# Patient Record
Sex: Male | Born: 2000 | Hispanic: Yes | Marital: Single | Smoking: Current some day smoker
Health system: Southern US, Community
[De-identification: ages and names within clinical notes are randomized; demographics above are authoritative.]

---

## 2020-07-30 ENCOUNTER — Ambulatory Visit (INDEPENDENT_AMBULATORY_CARE_PROVIDER_SITE_OTHER): Payer: Self-pay

## 2020-07-30 VITALS — Wt 140.0 lb

## 2020-07-30 DIAGNOSIS — Z23 Encounter for immunization: Secondary | ICD-10-CM

## 2020-07-30 NOTE — Progress Notes (Signed)
   Covid-19 Vaccination Clinic  Name:  Isaiah Bates    MRN: [id number] DOB: 11/06/2001  07/30/2020  Mr. Isaiah Bates was observed post Covid-19 immunization for 15 minutes without incident. He was provided with Vaccine Information Sheet and instruction to access the V-Safe system.   Mr. Isaiah Bates was instructed to call 911 with any severe reactions post vaccine: Marland Kitchen Difficulty breathing  . Swelling of face and throat  . A fast heartbeat  . A bad rash all over body  . Dizziness and weakness   Immunizations Administered    Name Date Dose VIS Date Route   Pfizer COVID-19 Vaccine 07/30/2020 11:27 AM 0.3 mL 01/27/2019 Intramuscular   Manufacturer: [employer], [employer]   Lot: [id number]   NDC: [address]-8

## 2020-08-20 ENCOUNTER — Ambulatory Visit: Payer: Self-pay

## 2020-08-27 ENCOUNTER — Other Ambulatory Visit: Payer: Self-pay

## 2020-08-27 ENCOUNTER — Ambulatory Visit (INDEPENDENT_AMBULATORY_CARE_PROVIDER_SITE_OTHER): Payer: HRSA Program

## 2020-08-27 DIAGNOSIS — Z23 Encounter for immunization: Secondary | ICD-10-CM | POA: Diagnosis not present

## 2020-08-27 NOTE — Progress Notes (Signed)
° °  Covid-19 Vaccination Clinic  Name:  Isaiah Bates    MRN: [id number] DOB: 2001-09-15  08/27/2020  Mr. Isaiah Bates was observed post Covid-19 immunization for 15 minutes without incident. He was provided with Vaccine Information Sheet and instruction to access the V-Safe system.   Mr. Isaiah Bates was instructed to call 911 with any severe reactions post vaccine:  Difficulty breathing   Swelling of face and throat   A fast heartbeat   A bad rash all over body   Dizziness and weakness   Immunizations Administered    Name Date Dose VIS Date Route   Pfizer COVID-19 Vaccine 08/27/2020 11:35 AM 0.3 mL 01/27/2019 Intramuscular   Manufacturer: [employer], [employer]   Lot: [id number]   NDC: [id number]

## 2021-01-24 ENCOUNTER — Other Ambulatory Visit: Payer: Self-pay

## 2021-01-24 ENCOUNTER — Ambulatory Visit
Admission: RE | Admit: 2021-01-24 | Discharge: 2021-01-24 | Disposition: A | Discharge status: Home / Self Care | Payer: No Typology Code available for payment source | Source: Ambulatory Visit | Attending: Nurse Practitioner | Admitting: Nurse Practitioner

## 2021-01-24 ENCOUNTER — Other Ambulatory Visit: Payer: Self-pay | Admitting: Nurse Practitioner

## 2021-01-24 DIAGNOSIS — M25511 Pain in right shoulder: Secondary | ICD-10-CM

## 2021-07-10 ENCOUNTER — Emergency Department (HOSPITAL_COMMUNITY): Payer: Self-pay

## 2021-07-10 ENCOUNTER — Emergency Department (HOSPITAL_COMMUNITY)
Admission: EM | Admit: 2021-07-10 | Discharge: 2021-07-10 | Disposition: A | Discharge status: Home / Self Care | Payer: Self-pay | Attending: Student | Admitting: Student

## 2021-07-10 DIAGNOSIS — W01198A Fall on same level from slipping, tripping and stumbling with subsequent striking against other object, initial encounter: Secondary | ICD-10-CM | POA: Insufficient documentation

## 2021-07-10 DIAGNOSIS — S02119A Unspecified fracture of occiput, initial encounter for closed fracture: Secondary | ICD-10-CM

## 2021-07-10 DIAGNOSIS — S0211GA Other fracture of occiput, right side, initial encounter for closed fracture: Secondary | ICD-10-CM | POA: Insufficient documentation

## 2021-07-10 DIAGNOSIS — U071 COVID-19: Secondary | ICD-10-CM | POA: Insufficient documentation

## 2021-07-10 LAB — CBC WITH DIFFERENTIAL/PLATELET
Abs Immature Granulocytes: 0.02 10*3/uL (ref 0.00–0.07)
Basophils Absolute: 0 10*3/uL (ref 0.0–0.1)
Basophils Relative: 1 %
Eosinophils Absolute: 0.1 10*3/uL (ref 0.0–0.5)
Eosinophils Relative: 1 %
HCT: 47 % (ref 39.0–52.0)
Hemoglobin: 15.2 g/dL (ref 13.0–17.0)
Immature Granulocytes: 0 %
Lymphocytes Relative: 13 %
Lymphs Abs: 0.8 10*3/uL (ref 0.7–4.0)
MCH: 28.6 pg (ref 26.0–34.0)
MCHC: 32.3 g/dL (ref 30.0–36.0)
MCV: 88.5 fL (ref 80.0–100.0)
Monocytes Absolute: 0.7 10*3/uL (ref 0.1–1.0)
Monocytes Relative: 12 %
Neutro Abs: 4.2 10*3/uL (ref 1.7–7.7)
Neutrophils Relative %: 73 %
Platelets: 314 10*3/uL (ref 150–400)
RBC: 5.31 MIL/uL (ref 4.22–5.81)
RDW: 14.7 % (ref 11.5–15.5)
WBC: 5.8 10*3/uL (ref 4.0–10.5)
nRBC: 0 % (ref 0.0–0.2)

## 2021-07-10 LAB — BASIC METABOLIC PANEL
Anion gap: 10 (ref 5–15)
BUN: 11 mg/dL (ref 6–20)
CO2: 27 mmol/L (ref 22–32)
Calcium: 9.7 mg/dL (ref 8.9–10.3)
Chloride: 102 mmol/L (ref 98–111)
Creatinine, Ser: 0.8 mg/dL (ref 0.61–1.24)
GFR, Estimated: >60 mL/min (ref >60)
Glucose, Bld: 121 mg/dL — ABNORMAL HIGH (ref 70–99)
Potassium: 3.7 mmol/L (ref 3.5–5.1)
Sodium: 139 mmol/L (ref 135–145)

## 2021-07-10 LAB — MAGNESIUM: Magnesium: 2.2 mg/dL (ref 1.7–2.4)

## 2021-07-10 MED ORDER — OXYCODONE-ACETAMINOPHEN 5-325 MG PO TABS: 1.0000 | ORAL_TABLET | Freq: Four times a day (QID) | ORAL | 0 refills | Status: AC | PRN

## 2021-07-10 MED ORDER — IBUPROFEN 600 MG PO TABS: 600.0000 mg | ORAL_TABLET | Freq: Three times a day (TID) | ORAL | 0 refills | Status: AC

## 2021-07-10 MED ORDER — OXYCODONE-ACETAMINOPHEN 5-325 MG PO TABS
1.0000 | ORAL_TABLET | Freq: Once | ORAL | Status: AC
  Administered 2021-07-10: 1 via ORAL
  Filled 2021-07-10: qty 1

## 2021-07-10 MED ORDER — IBUPROFEN 400 MG PO TABS
600.0000 mg | ORAL_TABLET | Freq: Once | ORAL | Status: AC
  Administered 2021-07-10: 600 mg via ORAL
  Filled 2021-07-10: qty 1

## 2021-07-10 NOTE — Discharge Instructions (Addendum)
You are seen in the emergency department for evaluation of headache after a fall.  Your CAT scans show a small skull fracture on the back of the right side of the head but no broken bones in the nose.  You are safe for discharge at this time and this skull fracture will heal on its own.  I have provided the number for a neurosurgeon to follow-up if your head pain is continuing.  I will provide a short course of opioid pain medication that is only to be used for breakthrough pain only.  Please use ibuprofen for pain control 3 times daily 600 mg.  Turn the emergency department if he have new or worsening nausea, vomiting, difficulty walking, numbness, tingling or any other concerning symptoms.

## 2021-07-10 NOTE — ED Triage Notes (Signed)
Pt here d/t falling and hitting head Saturday. Pt endorses hitting head on grill and LOC per family. Pt alert and oriented X4.

## 2021-07-10 NOTE — ED Provider Notes (Signed)
[facility] EMERGENCY DEPARTMENT Provider Note   CSN: [id number] Arrival date & time: 07/10/21  1130     History Chief Complaint  Patient presents with   Head Injury    Isaiah Bates is a 20 y.o. male with no significant past medical history who presents to the emergency department for evaluation of a head injury.  Patient states that approximately 3 AM on 07/08/2021 the patient tripped and struck the back of his head against a grill and fell forward onto his face.  He states that he had loss of consciousness of unknown duration.  He states over the last 24 hours he has had persistent lightheadedness and dizziness but no nausea, vomiting, diarrhea, numbness, tingling, weakness or other neurologic or systemic complaints.  He states he has had decreased appetite and has not had any p.o. over the last 2 days.  Patient endorses posterior headache, pain over the bridge of the nose and is requesting a COVID test today.   Head Injury Associated symptoms: headache and nausea   Associated symptoms: no seizures and no vomiting       No past medical history on file.  There are no problems to display for this patient.     No family history on file.     Home Medications Prior to Admission medications   Medication Sig Start Date End Date Taking? Authorizing Provider  ibuprofen (ADVIL) 600 MG tablet Take 1 tablet (600 mg total) by mouth 3 (three) times daily. 07/10/21  Yes Cortlandt Capuano, MD  oxyCODONE-acetaminophen (PERCOCET/ROXICET) 5-325 MG tablet Take 1 tablet by mouth every 6 (six) hours as needed for severe pain. 07/10/21  Yes Yale Golla, MD    Allergies    Patient has no allergy information on record.  Review of Systems   Review of Systems  Constitutional:  Negative for chills and fever.  HENT:  Negative for ear pain and sore throat.   Eyes:  Negative for pain and visual disturbance.  Respiratory:  Negative for cough and shortness of breath.    Cardiovascular:  Negative for chest pain and palpitations.  Gastrointestinal:  Positive for nausea. Negative for abdominal pain and vomiting.  Genitourinary:  Negative for dysuria and hematuria.  Musculoskeletal:  Negative for arthralgias and back pain.  Skin:  Negative for color change and rash.  Neurological:  Positive for light-headedness and headaches. Negative for seizures and syncope.  All other systems reviewed and are negative.  Physical Exam Updated Vital Signs BP 123/73   Pulse 78   Temp 98.8 F (37.1 C) (Oral)   Resp 17   SpO2 100%   Physical Exam Vitals and nursing note reviewed.  Constitutional:      Appearance: He is well-developed.  HENT:     Head: Normocephalic.  Eyes:     Conjunctiva/sclera: Conjunctivae normal.  Cardiovascular:     Rate and Rhythm: Normal rate and regular rhythm.     Heart sounds: No murmur heard. Pulmonary:     Effort: Pulmonary effort is normal. No respiratory distress.     Breath sounds: Normal breath sounds.  Abdominal:     Palpations: Abdomen is soft.     Tenderness: There is no abdominal tenderness.  Musculoskeletal:        General: Tenderness (Right posterior occiput, on nasal bridge) present.     Cervical back: Neck supple.  Skin:    General: Skin is warm and dry.  Neurological:     General: No focal deficit present.  Mental Status: He is alert and oriented to person, place, and time.     Cranial Nerves: No cranial nerve deficit.     Sensory: No sensory deficit.     Motor: No weakness.    ED Results / Procedures / Treatments   Labs (all labs ordered are listed, but only abnormal results are displayed) Labs Reviewed  BASIC METABOLIC PANEL - Abnormal; Notable for the following components:      Result Value   Glucose, Bld 121 (*)    All other components within normal limits  SARS CORONAVIRUS 2 (TAT 6-24 HRS)  CBC WITH DIFFERENTIAL/PLATELET  MAGNESIUM    EKG None  Radiology CT Head Wo Contrast  Result Date:  07/10/2021 CLINICAL DATA:  Recent fall, head and neck injury, persistent pain EXAM: CT HEAD WITHOUT CONTRAST CT CERVICAL SPINE WITHOUT CONTRAST TECHNIQUE: Multidetector CT imaging of the head and cervical spine was performed following the standard protocol without intravenous contrast. Multiplanar CT image reconstructions of the cervical spine were also generated. COMPARISON:  None. FINDINGS: CT HEAD FINDINGS Brain: No acute intracranial hemorrhage mass lesion, midline shift, herniation or hydrocephalus. Gray-white matter differentiation maintained. No focal mass effect or edema. Cisterns are patent. 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 Vascular: No hyperdense vessel or unexpected calcification. Skull: Acute nondisplaced right lateral occipital skull fracture noted beginning medial to the suture line but does extend inferiorly to the skull base along the jugular foramen and medial to the mastoids. Sinuses/Orbits: No acute finding. Other: None. CT CERVICAL SPINE FINDINGS Alignment: Normal. Skull base and vertebrae: No acute fracture. No primary bone lesion or focal pathologic process. Soft tissues and spinal canal: No prevertebral fluid or swelling. No visible canal hematoma. Disc levels:  Preserved vertebral body heights and disc spaces. Upper chest: Negative. Other: None. IMPRESSION: Acute nondisplaced right posterior occipital skull fracture, medial to the suture line but extends inferiorly to the right skull base along the jugular foramen. No acute intracranial abnormality. Mega cisterna magna noted. No acute cervical spine fracture, malalignment by CT. Electronically Signed   By: Judie Petit.  Shick M.D.   On: 07/10/2021 14:37   CT Cervical Spine Wo Contrast  Result Date: 07/10/2021 CLINICAL DATA:  Recent fall, head and neck injury, persistent pain EXAM: CT HEAD WITHOUT CONTRAST CT CERVICAL SPINE WITHOUT CONTRAST TECHNIQUE: Multidetector CT imaging of the head and cervical spine was performed following  the standard protocol without intravenous contrast. Multiplanar CT image reconstructions of the cervical spine were also generated. COMPARISON:  None. FINDINGS: CT HEAD FINDINGS Brain: No acute intracranial hemorrhage mass lesion, midline shift, herniation or hydrocephalus. Gray-white matter differentiation maintained. No focal mass effect or edema. Cisterns are patent. 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 Vascular: No hyperdense vessel or unexpected calcification. Skull: Acute nondisplaced right lateral occipital skull fracture noted beginning medial to the suture line but does extend inferiorly to the skull base along the jugular foramen and medial to the mastoids. Sinuses/Orbits: No acute finding. Other: None. CT CERVICAL SPINE FINDINGS Alignment: Normal. Skull base and vertebrae: No acute fracture. No primary bone lesion or focal pathologic process. Soft tissues and spinal canal: No prevertebral fluid or swelling. No visible canal hematoma. Disc levels:  Preserved vertebral body heights and disc spaces. Upper chest: Negative. Other: None. IMPRESSION: Acute nondisplaced right posterior occipital skull fracture, medial to the suture line but extends inferiorly to the right skull base along the jugular foramen. No acute intracranial abnormality. Mega cisterna magna noted. No acute cervical spine fracture, malalignment  by CT. Electronically Signed   By: Judie Petit.  Shick M.D.   On: 07/10/2021 14:37   CT Maxillofacial Wo Contrast  Result Date: 07/10/2021 CLINICAL DATA:  Fall face forward 2 days ago. Facial pain. Question nasal fracture. EXAM: CT MAXILLOFACIAL WITHOUT CONTRAST TECHNIQUE: Multidetector CT imaging of the maxillofacial structures was performed. Multiplanar CT image reconstructions were also generated. COMPARISON:  None. FINDINGS: Osseous: Nasal bones are intact. Chronic leftward nasal septal deviation noted without obstruction. Mandible is intact. No acute facial fracture is present. Orbits:  The globes and orbits are within normal limits. Sinuses: The paranasal sinuses and mastoid air cells are clear. Soft tissues: Soft tissue swelling is present over the left side of the face without underlying fracture. Limited intracranial: No significant or unexpected finding. IMPRESSION: 1. Soft tissue swelling over the left side of the face without underlying fracture. 2. No acute facial fracture. 3. 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 Electronically Signed   By: Marin Roberts M.D.   On: 07/10/2021 19:24    Procedures Procedures   Medications Ordered in ED Medications  oxyCODONE-acetaminophen (PERCOCET/ROXICET) 5-325 MG per tablet 1 tablet (1 tablet Oral Given 07/10/21 1756)  ibuprofen (ADVIL) tablet 600 mg (600 mg Oral Given 07/10/21 1757)    ED Course  I have reviewed the triage vital signs and the nursing notes.  Pertinent labs & imaging results that were available during my care of the patient were reviewed by me and considered in my medical decision making (see chart for details).    MDM Rules/Calculators/A&P                           Patient seen in the emergency department for evaluation of headache after a fall.  Physical exam reveals tenderness and bogginess to the right posterior occiput as well as tenderness and abrasion over the nasal bridge.  Neurologic exam unremarkable with no focal motor or sensory deficits.  CT head with Acute nondisplaced right posterior occipital skull fracture, no evidence of intracranial hemorrhage or significant intracranial pathology.  CT C-spine unremarkable and CT face with no nasal bone fracture.  I spoke with neurosurgery briefly who agreed with no operative intervention for the patient's skull fracture and states that he can follow-up in the outpatient setting.  Patient was provided with pain control and a COVID test was obtained per patient request.  I will call the patient with results if his COVID test is positive.   Patient able to eat and ambulate in the emergency part without difficulty and he was discharged with a prescription for ibuprofen and Percocet for breakthrough pain only. Final Clinical Impression(s) / ED Diagnoses Final diagnoses:  Closed fracture of right side of occipital bone, unspecified occipital fracture type, initial encounter [facility])    Rx / DC Orders ED Discharge Orders          Ordered    oxyCODONE-acetaminophen (PERCOCET/ROXICET) 5-325 MG tablet  Every 6 hours PRN        07/10/21 2001    ibuprofen (ADVIL) 600 MG tablet  3 times daily        07/10/21 2001             Osei Anger, Wyn Forster, MD 07/10/21 2152

## 2021-07-10 NOTE — ED Notes (Signed)
Pt verbalizes understanding of discharge instructions. Opportunity for questions and answers were provided. Pt discharged from the ED.   ?

## 2021-07-10 NOTE — ED Provider Notes (Signed)
Emergency Medicine Provider Triage Evaluation Note  Isaiah Bates , a 20 y.o. male  was evaluated in triage.  Pt complains of head injury that occurred Saturday night. Pt reports drinking 3 beers and 2 shots of tequila when he fell and hit his head on the grill with positive LOC. He reported to his PCP that his friends stated he was having "seizure like activity" without hx of same. He reports that since that time he has had pain to his posterior head and neck with nausea and vomiting. Sent by PCP for imaging.   Review of Systems  Positive: + headache, neck pain, nausea, vomiting Negative: - blurry vision, double vision  Physical Exam  BP (!) 150/76   Pulse 61   Temp 97.7 F (36.5 C) (Oral)   Resp 16   SpO2 100%  Gen:   Awake, no distress   Resp:  Normal effort  MSK:   Moves extremities without difficulty  Other:  Neuro intact. + midline C spine TTP with associated paracervical musculature TTP and + right occipital TTP. No raccoon sign or battle's sign.   Medical Decision Making  Medically screening exam initiated at 12:07 PM.  Appropriate orders placed.  Coyle Stordahl Lowell was informed that the remainder of the evaluation will be completed by another provider, this initial triage assessment does not replace that evaluation, and the importance of remaining in the ED until their evaluation is complete.  Sent from PCP for CT head and CT neck with + head injury/questionable seizure like activity. Labs and imaging ordered. Pt was intoxicated when this occurred.    Tanda Rockers, PA-C 07/10/21 1209    Gloris Manchester, MD 07/10/21 [phone]

## 2021-07-10 NOTE — ED Notes (Signed)
Patient transported to CT 

## 2021-07-11 LAB — SARS CORONAVIRUS 2 (TAT 6-24 HRS): SARS Coronavirus 2: POSITIVE — AB

## 2021-08-02 ENCOUNTER — Other Ambulatory Visit: Payer: Self-pay

## 2021-08-02 ENCOUNTER — Emergency Department (HOSPITAL_COMMUNITY)
Admission: EM | Admit: 2021-08-02 | Discharge: 2021-08-02 | Disposition: A | Discharge status: Home / Self Care | Payer: Self-pay | Attending: Emergency Medicine | Admitting: Emergency Medicine

## 2021-08-02 ENCOUNTER — Encounter (HOSPITAL_COMMUNITY): Payer: Self-pay | Admitting: Emergency Medicine

## 2021-08-02 ENCOUNTER — Emergency Department (HOSPITAL_COMMUNITY): Payer: Self-pay

## 2021-08-02 DIAGNOSIS — R0789 Other chest pain: Secondary | ICD-10-CM | POA: Insufficient documentation

## 2021-08-02 DIAGNOSIS — F17210 Nicotine dependence, cigarettes, uncomplicated: Secondary | ICD-10-CM | POA: Insufficient documentation

## 2021-08-02 DIAGNOSIS — R1011 Right upper quadrant pain: Secondary | ICD-10-CM | POA: Insufficient documentation

## 2021-08-02 DIAGNOSIS — R599 Enlarged lymph nodes, unspecified: Secondary | ICD-10-CM | POA: Insufficient documentation

## 2021-08-02 LAB — CBC WITH DIFFERENTIAL/PLATELET
Abs Immature Granulocytes: 0.02 10*3/uL (ref 0.00–0.07)
Basophils Absolute: 0.1 10*3/uL (ref 0.0–0.1)
Basophils Relative: 1 %
Eosinophils Absolute: 0.2 10*3/uL (ref 0.0–0.5)
Eosinophils Relative: 3 %
HCT: 45.6 % (ref 39.0–52.0)
Hemoglobin: 14.8 g/dL (ref 13.0–17.0)
Immature Granulocytes: 0 %
Lymphocytes Relative: 22 %
Lymphs Abs: 1.6 10*3/uL (ref 0.7–4.0)
MCH: 28 pg (ref 26.0–34.0)
MCHC: 32.5 g/dL (ref 30.0–36.0)
MCV: 86.2 fL (ref 80.0–100.0)
Monocytes Absolute: 0.8 10*3/uL (ref 0.1–1.0)
Monocytes Relative: 11 %
Neutro Abs: 4.6 10*3/uL (ref 1.7–7.7)
Neutrophils Relative %: 63 %
Platelets: 309 10*3/uL (ref 150–400)
RBC: 5.29 MIL/uL (ref 4.22–5.81)
RDW: 13.1 % (ref 11.5–15.5)
WBC: 7.3 10*3/uL (ref 4.0–10.5)
nRBC: 0 % (ref 0.0–0.2)

## 2021-08-02 LAB — COMPREHENSIVE METABOLIC PANEL
ALT: 39 U/L (ref 0–44)
AST: 37 U/L (ref 15–41)
Albumin: 4 g/dL (ref 3.5–5.0)
Alkaline Phosphatase: 64 U/L (ref 38–126)
Anion gap: 9 (ref 5–15)
BUN: 19 mg/dL (ref 6–20)
CO2: 31 mmol/L (ref 22–32)
Calcium: 9.5 mg/dL (ref 8.9–10.3)
Chloride: 97 mmol/L — ABNORMAL LOW (ref 98–111)
Creatinine, Ser: 0.77 mg/dL (ref 0.61–1.24)
GFR, Estimated: >60 mL/min (ref >60)
Glucose, Bld: 90 mg/dL (ref 70–99)
Potassium: 4.1 mmol/L (ref 3.5–5.1)
Sodium: 137 mmol/L (ref 135–145)
Total Bilirubin: 0.8 mg/dL (ref 0.3–1.2)
Total Protein: 6.9 g/dL (ref 6.5–8.1)

## 2021-08-02 LAB — MONONUCLEOSIS SCREEN: Mono Screen: NEGATIVE

## 2021-08-02 LAB — LIPASE, BLOOD: Lipase: 38 U/L (ref 11–51)

## 2021-08-02 LAB — LACTATE DEHYDROGENASE: LDH: 164 U/L (ref 98–192)

## 2021-08-02 LAB — TROPONIN I (HIGH SENSITIVITY): Troponin I (High Sensitivity): 4 ng/L (ref <18)

## 2021-08-02 MED ORDER — ONDANSETRON HCL 4 MG/2ML IJ SOLN
4.0000 mg | Freq: Once | INTRAMUSCULAR | Status: AC
  Administered 2021-08-02: 4 mg via INTRAVENOUS
  Filled 2021-08-02: qty 2

## 2021-08-02 MED ORDER — IOHEXOL 350 MG/ML SOLN
60.0000 mL | Freq: Once | INTRAVENOUS | Status: AC | PRN
  Administered 2021-08-02: 60 mL via INTRAVENOUS

## 2021-08-02 NOTE — ED Triage Notes (Signed)
Pt complains of a "knot" under his left armpit. No drainage. Very tender to touch.

## 2021-08-02 NOTE — Discharge Planning (Signed)
Storie Heffern J. Lucretia Roers, RN, BSN, [place] [phone]  RNCM set up appointment with [facility]  on 9/30 @ 0900.  Appointment date and time placed on After Visit Summary (AVS).

## 2021-08-02 NOTE — Discharge Instructions (Addendum)
Follow-up with Dr. Mosetta Putt in 2 to 3 weeks with oncology.

## 2021-08-02 NOTE — ED Provider Notes (Signed)
[facility] EMERGENCY DEPARTMENT Provider Note   CSN: [id number] Arrival date & time: 08/02/21  1044     History Chief Complaint  Patient presents with   Abdominal Pain   Chest Pain    Isaiah Bates is a 20 y.o. male.  The history is provided by the patient.  Abdominal Pain Pain location:  RUQ Pain quality: aching   Pain radiates to:  Does not radiate Pain severity:  No pain Chronicity:  New Relieved by:  Nothing Worsened by:  Nothing Associated symptoms: chest pain   Associated symptoms: no chills, no cough, no dysuria, no fever, no hematuria, no shortness of breath, no sore throat and no vomiting   Chest Pain Pain location:  L chest (LEFT AXilla) Pain severity:  Mild Onset quality:  Gradual Duration:  4 days Timing:  Constant Progression:  Unchanged Context: raising an arm (covid recently)   Associated symptoms: abdominal pain   Associated symptoms: no back pain, no cough, no fever, no palpitations, no shortness of breath and no vomiting   Risk factors: no birth control, no coronary artery disease, no high cholesterol and no hypertension       History reviewed. No pertinent past medical history.  There are no problems to display for this patient.   History reviewed. No pertinent surgical history.     No family history on file.  Social History   Tobacco Use   Smoking status: Some Days    Types: Cigarettes  Substance Use Topics   Alcohol use: Yes   Drug use: Yes    Types: Marijuana    Home Medications Prior to Admission medications   Medication Sig Start Date End Date Taking? Authorizing Provider  ibuprofen (ADVIL) 600 MG tablet Take 1 tablet (600 mg total) by mouth 3 (three) times daily. 07/10/21   Kommor, Madison, MD  oxyCODONE-acetaminophen (PERCOCET/ROXICET) 5-325 MG tablet Take 1 tablet by mouth every 6 (six) hours as needed for severe pain. 07/10/21   Kommor, Wyn Forster, MD    Allergies    Patient has no known  allergies.  Review of Systems   Review of Systems  Constitutional:  Negative for chills and fever.  HENT:  Negative for ear pain and sore throat.   Eyes:  Negative for pain and visual disturbance.  Respiratory:  Negative for cough and shortness of breath.   Cardiovascular:  Positive for chest pain. Negative for palpitations.  Gastrointestinal:  Positive for abdominal pain. Negative for vomiting.  Genitourinary:  Negative for dysuria and hematuria.  Musculoskeletal:  Negative for arthralgias and back pain.  Skin:  Negative for color change and rash.  Neurological:  Negative for seizures and syncope.  All other systems reviewed and are negative.  Physical Exam Updated Vital Signs BP 114/69 (BP Location: Right Arm)   Pulse 61   Temp 98 F (36.7 C) (Oral)   Resp 11   Ht 5\' 5"  (1.651 m)   Wt 54.4 kg   SpO2 100%   BMI 19.97 kg/m   Physical Exam Vitals and nursing note reviewed.  Constitutional:      General: He is not in acute distress.    Appearance: He is well-developed. He is not ill-appearing.  HENT:     Head: Normocephalic and atraumatic.     Mouth/Throat:     Mouth: Mucous membranes are moist.  Eyes:     Extraocular Movements: Extraocular movements intact.     Conjunctiva/sclera: Conjunctivae normal.     Pupils: Pupils are  equal, round, and reactive to light.  Cardiovascular:     Rate and Rhythm: Normal rate and regular rhythm.     Pulses:          Radial pulses are 2+ on the right side and 2+ on the left side.     Heart sounds: Normal heart sounds. No murmur heard. Pulmonary:     Effort: Pulmonary effort is normal. No respiratory distress.     Breath sounds: Normal breath sounds. No decreased breath sounds.  Chest:     Chest wall: Tenderness present.     Comments: Tenderness to the left axilla  Abdominal:     Palpations: Abdomen is soft.     Tenderness: There is abdominal tenderness (right anterior chest wall). There is no guarding or rebound. Negative signs  include Murphy's sign.     Hernia: No hernia is present.  Musculoskeletal:     Cervical back: Neck supple.  Skin:    General: Skin is warm and dry.     Capillary Refill: Capillary refill takes less than 2 seconds.  Neurological:     General: No focal deficit present.     Mental Status: He is alert.  Psychiatric:        Mood and Affect: Mood normal.    ED Results / Procedures / Treatments   Labs (all labs ordered are listed, but only abnormal results are displayed) Labs Reviewed  COMPREHENSIVE METABOLIC PANEL - Abnormal; Notable for the following components:      Result Value   Chloride 97 (*)    All other components within normal limits  CBC WITH DIFFERENTIAL/PLATELET  LIPASE, BLOOD  LACTATE DEHYDROGENASE  MONONUCLEOSIS SCREEN  TROPONIN I (HIGH SENSITIVITY)    EKG EKG Interpretation  Date/Time:  Wednesday August 02 2021 12:44:05 EDT Ventricular Rate:  62 PR Interval:  154 QRS Duration: 84 QT Interval:  423 QTC Calculation: 430 R Axis:   105 Text Interpretation: Sinus rhythm Right axis deviation ST elev, probable normal early repol pattern Confirmed by Virgina Norfolk ([id number]) on 08/02/2021 1:28:42 PM  Radiology CT Angio Chest PE W and/or Wo Contrast  Result Date: 08/02/2021 CLINICAL DATA:  recnet COVID, left axilla pain, SOB?, PE? EXAM: CT ANGIOGRAPHY CHEST WITH CONTRAST TECHNIQUE: Multidetector CT imaging of the chest was performed using the standard protocol during bolus administration of intravenous contrast. Multiplanar CT image reconstructions and MIPs were obtained to evaluate the vascular anatomy. CONTRAST:  81mL OMNIPAQUE IOHEXOL 350 MG/ML SOLN COMPARISON:  None. FINDINGS: Cardiovascular: Satisfactory opacification of the pulmonary arteries to the segmental level. No evidence of pulmonary embolism. Normal heart size. No pericardial effusion. Mediastinum/Nodes: Multiple enlarged left axillary lymph nodes, with the largest measuring up to 2.8 x 1.6 cm. The thyroid gland  appears normal. Lungs/Pleura: No pleural effusion. No pneumothorax. No mass or focal consolidation. No suspicious pulmonary nodules. Musculoskeletal: No aggressive osseous lesions. Upper abdomen: The visualized upper abdomen is unremarkable. Review of the MIP images confirms the above findings. IMPRESSION: 1. No evidence of pulmonary embolism. 2. Pathologically enlarged left axillary lymph nodes, with largest measuring up to 2.8 x 1.6 cm. Recommend left axillary ultrasound for more complete evaluation. Electronically Signed   By: Olive Bass M.D.   On: 08/02/2021 13:28   DG Chest Portable 1 View  Result Date: 08/02/2021 CLINICAL DATA:  Chest pain EXAM: PORTABLE CHEST 1 VIEW COMPARISON:  None. FINDINGS: The heart size and mediastinal contours are within normal limits. Both lungs are clear. The visualized skeletal structures are unremarkable.  IMPRESSION: No active disease. Electronically Signed   By: Allegra Lai M.D.   On: 08/02/2021 12:06    Procedures Procedures   Medications Ordered in ED Medications  ondansetron (ZOFRAN) injection 4 mg (4 mg Intravenous Given 08/02/21 1154)  iohexol (OMNIPAQUE) 350 MG/ML injection 60 mL (60 mLs Intravenous Contrast Given 08/02/21 1316)    ED Course  I have reviewed the triage vital signs and the nursing notes.  Pertinent labs & imaging results that were available during my care of the patient were reviewed by me and considered in my medical decision making (see chart for details).    MDM Rules/Calculators/A&P                           Shahab Polhamus is here for chest pain and abdominal pain.  Normal vitals.  No fever.  COVID infection several weeks ago.  Skull fracture several weeks ago after fall.  Started to develop some chest pain and abdominal pain a couple days ago.  Chest pain is mostly in the left axilla but there is no signs of infection or abscess or fluctuance or erythema.  Feels fairly focally tender over the right anterior rib wall.   Do not really have a Murphy sign but will check gallbladder labs, liver labs, lipase.  Will check troponin, EKG, CT scan of chest to evaluate for PE/pericarditis given recent COVID infection.  Could be an inflamed lymph node that is not easily palpable.  No significant leukocytosis, anemia, electrolyte abnormality.  CT scan negative for PE but does show enlarged lymph nodes in the axilla.  Left axillary lymph nodes with the largest being 2.8 x 1.6 cm.  Has not had any night sweats or weight loss.  Overall suspect this is viral in nature but he does not have primary care follow-up.  I talked with Dr. Mosetta Putt with oncology he will follow-up in clinic in 2 to 3 weeks.  She recommends sending off an LDH and Monospot.  Discharged in good condition.  Understands return precautions.  This chart was dictated using voice recognition software.  Despite best efforts to proofread,  errors can occur which can change the documentation meaning.   Final Clinical Impression(s) / ED Diagnoses Final diagnoses:  Enlarged lymph nodes    Rx / DC Orders ED Discharge Orders     None        Virgina Norfolk, DO 08/02/21 1502

## 2021-08-04 ENCOUNTER — Telehealth: Payer: Self-pay | Admitting: Hematology

## 2021-08-04 NOTE — Telephone Encounter (Signed)
Scheduled appt per 8/31 staff msg from Dr. Mosetta Putt. Using the intrepeter services I called pt with appt date and time. Pt did not answer and no vm was set up. Mailed updated calendar to pt.

## 2021-08-23 ENCOUNTER — Telehealth: Payer: Self-pay

## 2021-08-23 ENCOUNTER — Inpatient Hospital Stay: Payer: Self-pay | Attending: Hematology | Admitting: Hematology

## 2021-08-23 NOTE — Telephone Encounter (Signed)
This nurse attempted to reach patient related to appointment today.  This nurse was unable to reach patient and his voicemail box is not set up to receive messages.  This nurse also attempted to reach his mother which is alternate contact and her voicemail box is full.

## 2021-09-01 ENCOUNTER — Inpatient Hospital Stay (INDEPENDENT_AMBULATORY_CARE_PROVIDER_SITE_OTHER): Payer: Self-pay | Admitting: Primary Care

## 2023-02-10 IMAGING — CT CT HEAD W/O CM
4 series · 16 of 47 positions shown, 18 images · non-contrast
Comparison: None.

CLINICAL DATA: Recent fall, head and neck injury, persistent pain

EXAM:
CT HEAD WITHOUT CONTRAST
CT CERVICAL SPINE WITHOUT CONTRAST
TECHNIQUE: Multidetector CT imaging of the head and cervical spine was
performed following the standard protocol without intravenous
contrast. Multiplanar CT image reconstructions of the cervical spine
were also generated.

[Series 3: head bone · axial · 0.41mm/px · z∈[+1438,+1470]mm · 3 of 78 slices shown]
[im 8/78  bone]
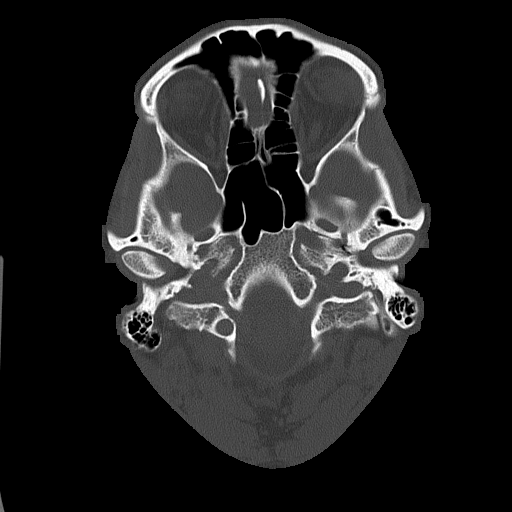
[im 16/78  bone]
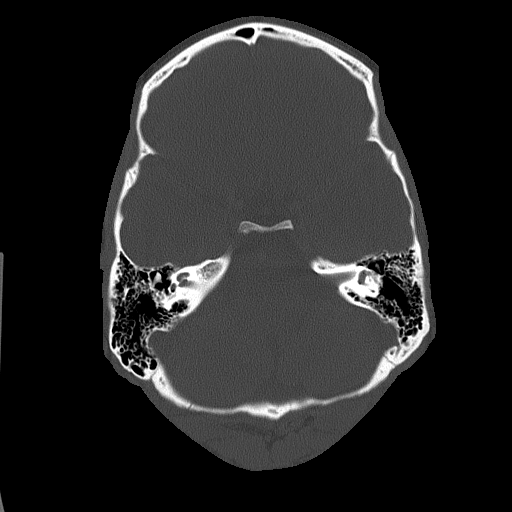
[im 24/78  bone]
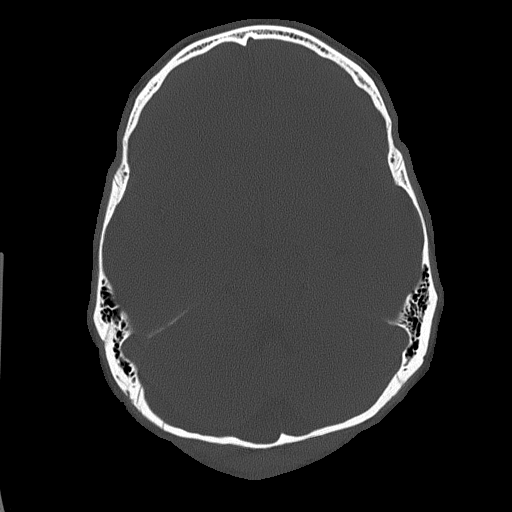

[Series 4: head without · axial · non-contrast · 0.41mm/px · z∈[+1439,+1559]mm · 7 of 32 slices shown, 9 images]
[im 4/32  brain]
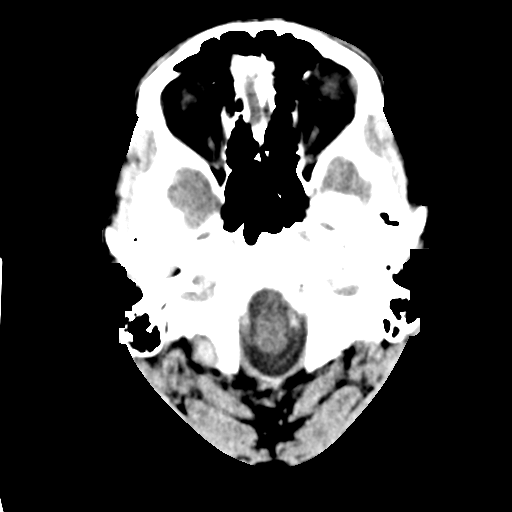
[im 4/32  bone]
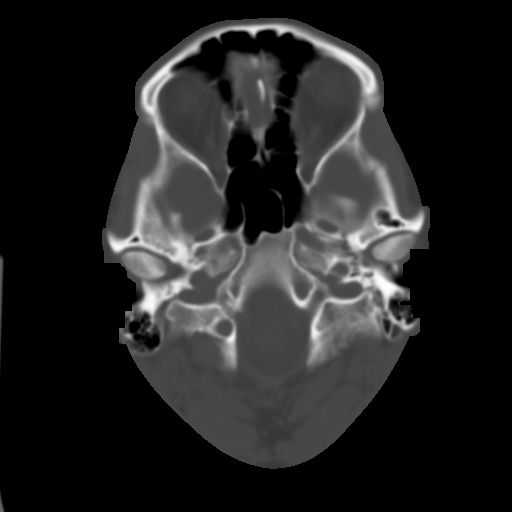
[im 8/32  brain]
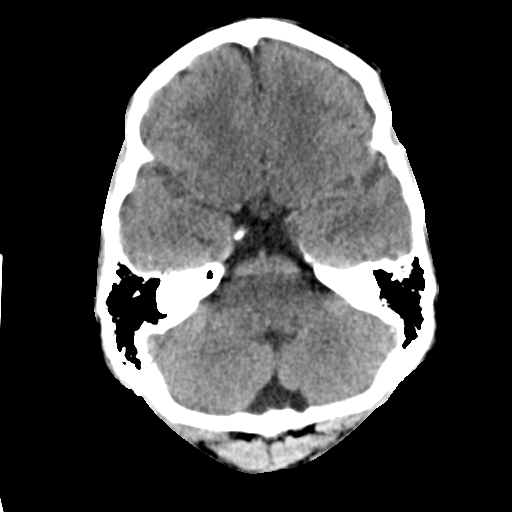
[im 12/32  brain]
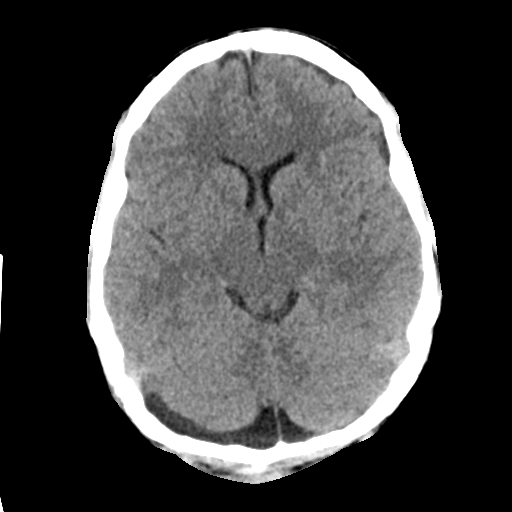
[im 16/32  brain]
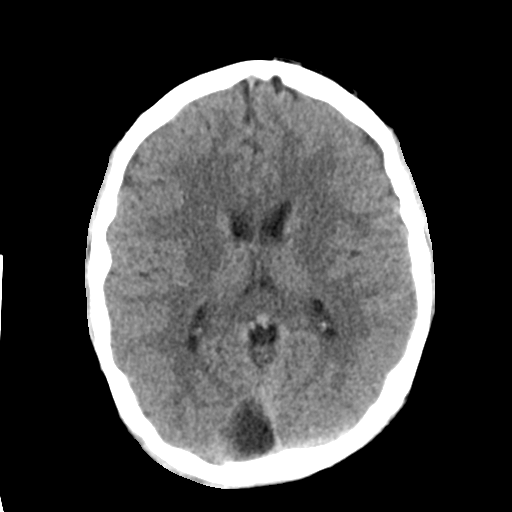
[im 20/32  brain]
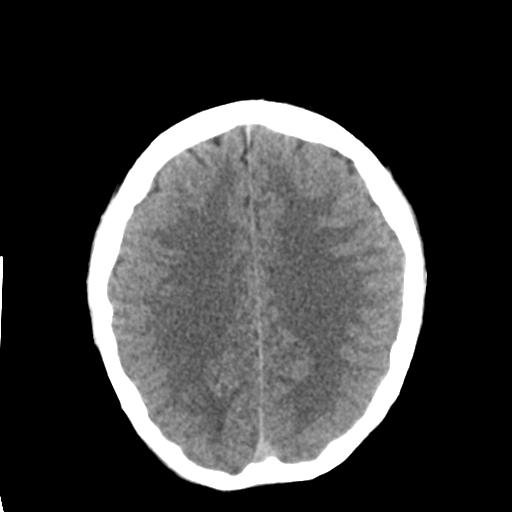
[im 20/32  bone]
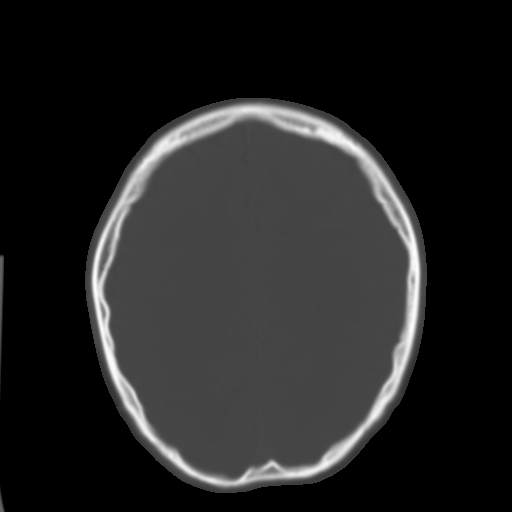
[im 24/32  brain]
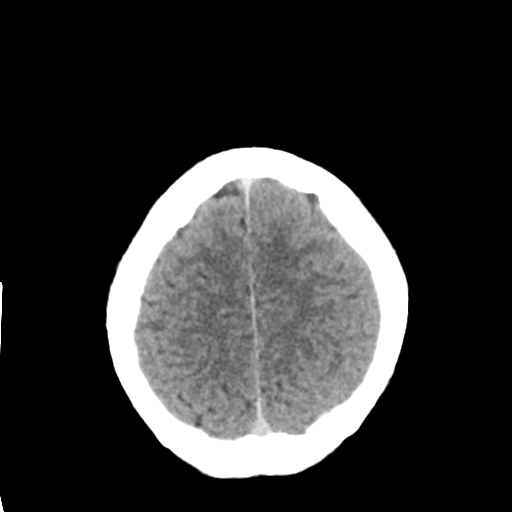
[im 28/32  brain]
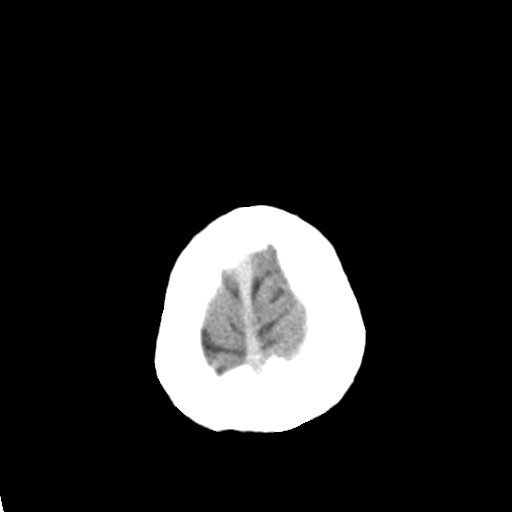

[Series 5: head without cor · coronal · non-contrast · 0.32mm/px · 3 of 70 slices shown]
[im 24/70  brain]
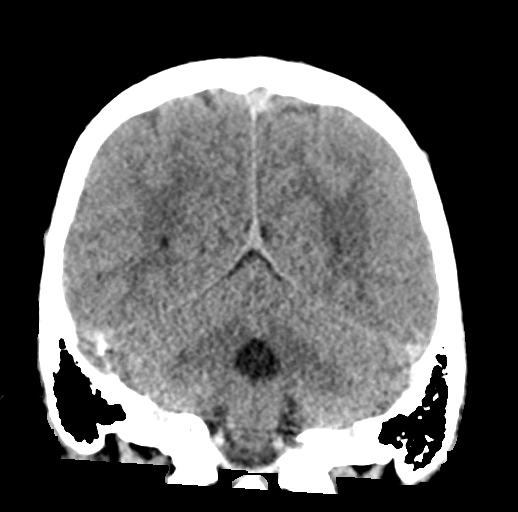
[im 31/70  brain]
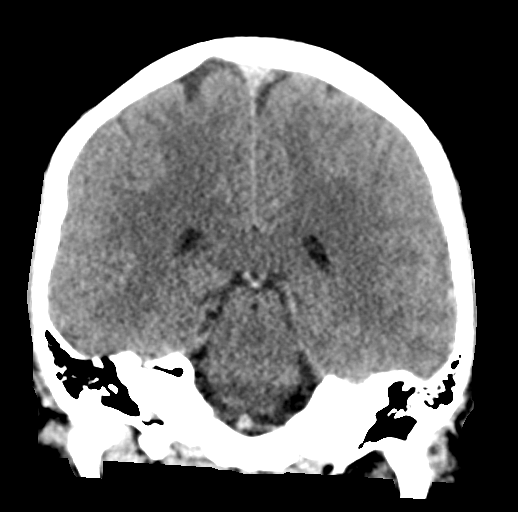
[im 39/70  brain]
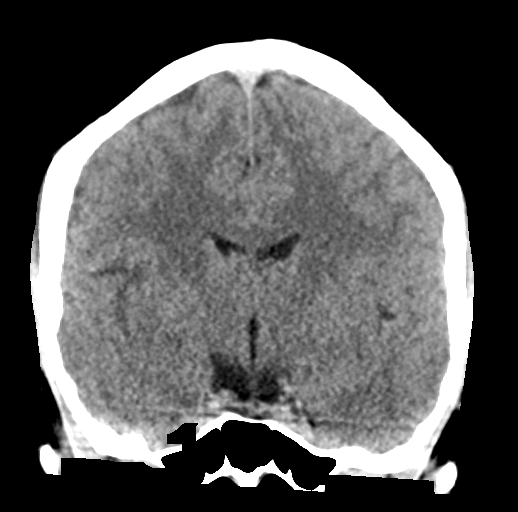

[Series 6: head without sag · sagittal · non-contrast · 0.32mm/px · 3 of 56 slices shown]
[im 19/56  brain]
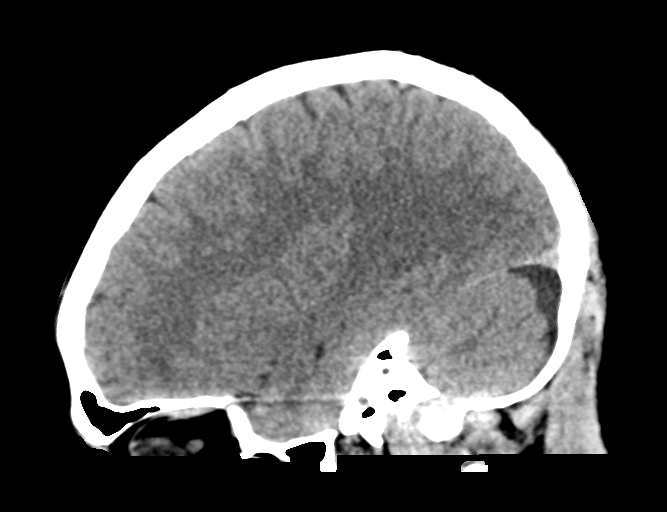
[im 28/56  brain]
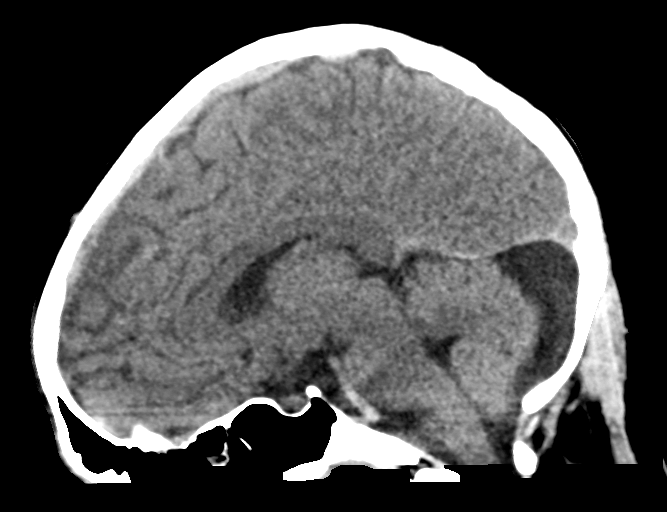
[im 37/56  brain]
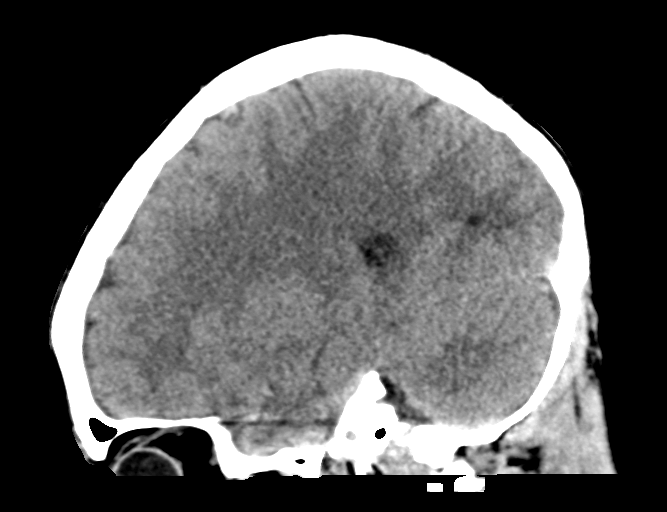

[16 of 47 positions shown; findings below may reference images not displayed]

FINDINGS: CT HEAD FINDINGS

Brain: No acute intracranial hemorrhage mass lesion, midline shift,
herniation or hydrocephalus. Gray-white matter differentiation
maintained. No focal mass effect or edema. Cisterns are patent.
Posterior fossa demonstrates a mega cisterna magna.

Vascular: No hyperdense vessel or unexpected calcification.

Skull: Acute nondisplaced right lateral occipital skull fracture
noted beginning medial to the suture line but does extend inferiorly
to the skull base along the jugular foramen and medial to the
mastoids.

Sinuses/Orbits: No acute finding.

Other: None.

CT CERVICAL SPINE FINDINGS

Alignment: Normal.

Skull base and vertebrae: No acute fracture. No primary bone lesion
or focal pathologic process.

Soft tissues and spinal canal: No prevertebral fluid or swelling. No
visible canal hematoma.

Disc levels:  Preserved vertebral body heights and disc spaces.

Upper chest: Negative.

Other: None.
IMPRESSION: Acute nondisplaced right posterior occipital skull fracture, medial
to the suture line but extends inferiorly to the right skull base
along the jugular foramen.

No acute intracranial abnormality.

Mega cisterna magna noted.

No acute cervical spine fracture, malalignment by CT.
